# Patient Record
Sex: Male | Born: 1935 | Race: White | Hispanic: No | Marital: Married | Smoking: Never smoker
Health system: Southern US, Community
[De-identification: ages and names within clinical notes are randomized; demographics above are authoritative.]

## PROBLEM LIST (undated history)

## (undated) DIAGNOSIS — E119 Type 2 diabetes mellitus without complications: Secondary | ICD-10-CM

## (undated) DIAGNOSIS — I2510 Atherosclerotic heart disease of native coronary artery without angina pectoris: Secondary | ICD-10-CM

## (undated) HISTORY — PX: CORONARY ANGIOPLASTY WITH STENT PLACEMENT: SHX49

## (undated) HISTORY — PX: HERNIA REPAIR: SHX51

---

## 2016-04-28 ENCOUNTER — Emergency Department (HOSPITAL_BASED_OUTPATIENT_CLINIC_OR_DEPARTMENT_OTHER)
Admission: EM | Admit: 2016-04-28 | Discharge: 2016-04-28 | Disposition: A | Discharge status: Home / Self Care | Payer: Medicare Other | Attending: Emergency Medicine | Admitting: Emergency Medicine

## 2016-04-28 ENCOUNTER — Emergency Department (HOSPITAL_BASED_OUTPATIENT_CLINIC_OR_DEPARTMENT_OTHER): Payer: Medicare Other

## 2016-04-28 ENCOUNTER — Encounter (HOSPITAL_BASED_OUTPATIENT_CLINIC_OR_DEPARTMENT_OTHER): Payer: Self-pay | Admitting: *Deleted

## 2016-04-28 DIAGNOSIS — W11XXXA Fall on and from ladder, initial encounter: Secondary | ICD-10-CM | POA: Diagnosis not present

## 2016-04-28 DIAGNOSIS — Y999 Unspecified external cause status: Secondary | ICD-10-CM | POA: Insufficient documentation

## 2016-04-28 DIAGNOSIS — I2510 Atherosclerotic heart disease of native coronary artery without angina pectoris: Secondary | ICD-10-CM | POA: Diagnosis not present

## 2016-04-28 DIAGNOSIS — E119 Type 2 diabetes mellitus without complications: Secondary | ICD-10-CM | POA: Diagnosis not present

## 2016-04-28 DIAGNOSIS — S32810A Multiple fractures of pelvis with stable disruption of pelvic ring, initial encounter for closed fracture: Secondary | ICD-10-CM | POA: Insufficient documentation

## 2016-04-28 DIAGNOSIS — S3021XA Contusion of penis, initial encounter: Secondary | ICD-10-CM | POA: Diagnosis not present

## 2016-04-28 DIAGNOSIS — W19XXXA Unspecified fall, initial encounter: Secondary | ICD-10-CM

## 2016-04-28 DIAGNOSIS — Y9389 Activity, other specified: Secondary | ICD-10-CM | POA: Insufficient documentation

## 2016-04-28 DIAGNOSIS — Y9259 Other trade areas as the place of occurrence of the external cause: Secondary | ICD-10-CM | POA: Insufficient documentation

## 2016-04-28 DIAGNOSIS — Z7982 Long term (current) use of aspirin: Secondary | ICD-10-CM | POA: Diagnosis not present

## 2016-04-28 DIAGNOSIS — Z79899 Other long term (current) drug therapy: Secondary | ICD-10-CM | POA: Insufficient documentation

## 2016-04-28 DIAGNOSIS — S3993XA Unspecified injury of pelvis, initial encounter: Secondary | ICD-10-CM | POA: Diagnosis present

## 2016-04-28 DIAGNOSIS — Z794 Long term (current) use of insulin: Secondary | ICD-10-CM | POA: Diagnosis not present

## 2016-04-28 DIAGNOSIS — R102 Pelvic and perineal pain: Secondary | ICD-10-CM

## 2016-04-28 HISTORY — DX: Atherosclerotic heart disease of native coronary artery without angina pectoris: I25.10

## 2016-04-28 HISTORY — DX: Type 2 diabetes mellitus without complications: E11.9

## 2016-04-28 MED ORDER — TRAMADOL HCL 50 MG PO TABS: 50.0000 mg | ORAL_TABLET | Freq: Four times a day (QID) | ORAL | 0 refills | Status: AC | PRN

## 2016-04-28 MED FILL — traMADol HCL 50 MG TABS: 50 | 4 days supply | Qty: 20 | Fill #0

## 2016-04-28 NOTE — ED Notes (Signed)
ED Provider at bedside. Pt getting dressed. Orthostatic vitals to be collected.

## 2016-04-28 NOTE — ED Triage Notes (Signed)
4 days ago he missed the last step on a step ladder. Fell. Pain in his right hip.

## 2016-04-28 NOTE — ED Notes (Signed)
MD made aware of orthostatic vital signs.

## 2016-04-28 NOTE — ED Notes (Signed)
Patient transported to CT 

## 2016-04-28 NOTE — ED Notes (Signed)
ED Provider at bedside. 

## 2016-04-28 NOTE — ED Notes (Signed)
Patient transported to X-ray 

## 2016-04-28 NOTE — ED Provider Notes (Signed)
[facility]   CSN: [id number] Arrival date & time: 04/28/16  1115     History   Chief Complaint Chief Complaint  Patient presents with  . Fall    HPI Jerry Randall is a 82 y.o. male.  HPI Patient reports he fell in his garage 4 days ago. He was on a stepladder and coming down and then missed a step and landed falling onto his right hip. He denies pain anywhere else. He reports that the pain has been fairly severe. He has been using a walker and getting about his house. He reports he now notes bruising on the inside of his leg. He has come for evaluation because it is not improving. He denies any other areas of pain or injury. Patient otherwise feels well. His daughter has been giving him Tylenol with some relief. No chest pain, shortness of breath, nausea, lightheadedness, headache, weakness numbness or tingling. Patient does not take any blood thinners. Past Medical History:  Diagnosis Date  . Coronary artery disease   . Diabetes mellitus without complication (HCC)     There are no active problems to display for this patient.   Past Surgical History:  Procedure Laterality Date  . CORONARY ANGIOPLASTY WITH STENT PLACEMENT    . HERNIA REPAIR         Home Medications    Prior to Admission medications   Medication Sig Start Date End Date Taking? Authorizing Provider  Aspirin (ASPIR-81 PO) Take by mouth.   Yes Historical Provider, MD  clopidogrel (PLAVIX) 75 MG tablet Take 75 mg by mouth daily.   Yes Historical Provider, MD  glipiZIDE (GLUCOTROL) 10 MG tablet Take 10 mg by mouth daily before breakfast.   Yes Historical Provider, MD  insulin glargine (LANTUS) 100 UNIT/ML injection Inject into the skin at bedtime.   Yes Historical Provider, MD  lisinopril (PRINIVIL,ZESTRIL) 40 MG tablet Take 40 mg by mouth daily.   Yes Historical Provider, MD  METFORMIN HCL PO Take by mouth.   Yes Historical Provider, MD  NIACIN PO Take by mouth.   Yes Historical  Provider, MD  pravastatin (PRAVACHOL) 80 MG tablet Take 80 mg by mouth daily.   Yes Historical Provider, MD  terazosin (HYTRIN) 5 MG capsule Take 5 mg by mouth at bedtime.   Yes Historical Provider, MD  traMADol (ULTRAM) 50 MG tablet Take 1 tablet (50 mg total) by mouth every 6 (six) hours as needed. One to two tablets every 6 hours as needed for pain 04/28/16   Arby BarretteMarcy Joetta Delprado, MD    Family History No family history on file.  Social History Social History  Substance Use Topics  . Smoking status: Never Smoker  . Smokeless tobacco: Never Used  . Alcohol use No     Allergies   Patient has no known allergies.   Review of Systems Review of Systems 10 Systems reviewed and are negative for acute change except as noted in the HPI.   Physical Exam Updated Vital Signs BP 128/67 (BP Location: Right Arm)   Pulse 87   Temp 98.1 F (36.7 C) (Oral)   Resp 18   SpO2 100%   Physical Exam  Constitutional: He is oriented to person, place, and time. He appears well-developed and well-nourished.  HENT:  Head: Normocephalic and atraumatic.  Eyes: Conjunctivae are normal.  Neck: Neck supple.  Cardiovascular: Normal rate and regular rhythm.   No murmur heard. Pulmonary/Chest: Effort normal and breath sounds normal. No respiratory distress. He  exhibits no tenderness.  Abdominal: Soft. He exhibits no distension. There is no tenderness. There is no guarding.  Genitourinary:  Genitourinary Comments: Patient does have ecchymoses at the base of the penis. There is no swelling of the penis or the scrotum. There is tenderness to palpation in the deep inner groin at the abductor insertion.  Musculoskeletal: He exhibits no edema.  Patient does not have any tenderness over the greater trochanters are either side. I'm able to put the right hip through range of motion without significant tenderness. There is ever tenderness to palpation around the ishium. Patient has ecchymoses on the medial right thigh  following the abductor approximately 20 x 10 cm.  Neurological: He is alert and oriented to person, place, and time. No cranial nerve deficit. He exhibits normal muscle tone. Coordination normal.  Skin: Skin is warm and dry.  Psychiatric: He has a normal mood and affect.  Nursing note and vitals reviewed.    ED Treatments / Results  Labs (all labs ordered are listed, but only abnormal results are displayed) Labs Reviewed - No data to display  EKG  EKG Interpretation None       Radiology Dg Pelvis 1-2 Views  Result Date: 04/28/2016 CLINICAL DATA:  Fall 4 days ago from ladder with persistent right groin pain. EXAM: PELVIS - 1-2 VIEW COMPARISON:  None. FINDINGS: Age-indeterminate though presumably acute fractures involving the right superior inferior pubic rami. No definite intra-articular extension. No additional fractures identified. Mild-to-moderate degenerative change of the bilateral hips with joint space loss, subchondral sclerosis and osteophytosis. Degenerative change of the lower lumbar spine is suspected though incompletely evaluated. Regional soft tissues appear normal.  No radiopaque foreign body. IMPRESSION: Findings worrisome for age-indeterminate though presumably acute fractures involving the right superior and inferior pubic rami. Further evaluation with pelvic CT could be performed as indicated. Electronically Signed   By: Simonne ComeJohn  Watts M.D.   On: 04/28/2016 12:25   Ct Pelvis Wo Contrast  Result Date: 04/28/2016 CLINICAL DATA:  Patient states that 4 days ago he missed the last step on a step ladder and Fell, Pain in his right hip EXAM: CT PELVIS WITHOUT CONTRAST TECHNIQUE: Multidetector CT imaging of the pelvis was performed following the standard protocol without intravenous contrast. COMPARISON:  Current standard radiographs suggesting acute fractures of the right superior inferior pubic rami. FINDINGS: There are acute fractures of the right superior and inferior pubic rami.  The superior fracture lies adjacent to the pubic symphysis. The fracture is mildly comminuted. There is no significant displacement. There is an associated hematoma which measures 6 x 4.7 x 3.2 cm. This bulges into the extraperitoneal pelvis indenting the right inferior and lateral bladder. There is no evidence of a bladder injury, however. The inferior pubic rami fracture is oblique, nondisplaced and non comminuted. There are no other fractures. There are bilateral hip joint arthropathic changes with concentric hip joint space narrowing, small marginal osteophytes from the bases of the femoral heads and mild subchondral cystic change along the superior lateral acetabula. Mild degenerative changes noted along the SI joints inferiorly with subchondral sclerosis and inferior spurring. The SI joint is normally aligned. Bones are demineralized. There is no evidence of injury to the prostate or urethra. No free fluid is seen within the pelvis. Is no pelvic adenopathy. The visualized bowel is unremarkable. IMPRESSION: 1. Acute fractures of the right superior and inferior pubic rami as suspected on the current radiographs. 2. There is a hematoma associated with the superior pubic ramus  fracture that indents the right inferior aspect of the bladder. There is no evidence of bladder or urethral injury. 3. No other acute findings. Electronically Signed   By: Amie Portland M.D.   On: 04/28/2016 13:39    Procedures Procedures (including critical care time)  Medications Ordered in ED Medications - No data to display   Initial Impression / Assessment and Plan / ED Course  I have reviewed the triage vital signs and the nursing notes.  Pertinent labs & imaging results that were available during my care of the patient were reviewed by me and considered in my medical decision making (see chart for details).  Clinical Course   Consult: Have reviewed to Dr. Lindie Spruce. If patient is stable and without evidence of symptomatic  blood loss and able to urinate without obstruction, can continue to treat outpatient with pain control and follow-up.  Final Clinical Impressions(s) / ED Diagnoses   Final diagnoses:  Multiple closed fractures of pelvis with stable disruption of pelvic ring, initial encounter (HCC)   Patient fell 4 days ago. There is evidence of resolving hematoma with ecchymosis at the base of the penis in the medial thigh. Patient otherwise is well in appearance. He is alert with normal mental status change and no other complaints. He has been using a walker at home successfully and with the assistance of his daughter. At this time, patient does not show clinical evidence of ongoing bleeding or complications of hematoma around pelvic fracture. Plan will be to continue to treat for pain and have the patient nonweightbearing using a walker for transfers. Signs and symptoms which return are reviewed. New Prescriptions New Prescriptions   TRAMADOL (ULTRAM) 50 MG TABLET    Take 1 tablet (50 mg total) by mouth every 6 (six) hours as needed. One to two tablets every 6 hours as needed for pain     Arby Barrette, MD 04/28/16 [phone]

## 2018-04-30 IMAGING — CT CT PELVIS W/O CM
2 of 3 series · 16 of 46 positions shown, 18 images · non-contrast
Comparison: Current standard radiographs suggesting acute fractures
of the right superior inferior pubic rami.

CLINICAL DATA: Patient states that 4 days ago he missed the last
step on a step ladder and Fell, Pain in his right hip

EXAM:
CT PELVIS WITHOUT CONTRAST
TECHNIQUE: Multidetector CT imaging of the pelvis was performed following the
standard protocol without intravenous contrast.

[Series 2: axial bone · axial · 0.79mm/px · z∈[-279,-35]mm · 13 of 142 slices shown, 15 images]
[im 10/142  soft-tissue]
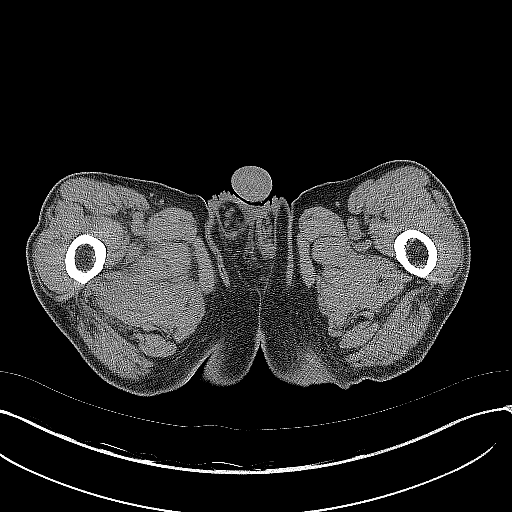
[im 10/142  bone]
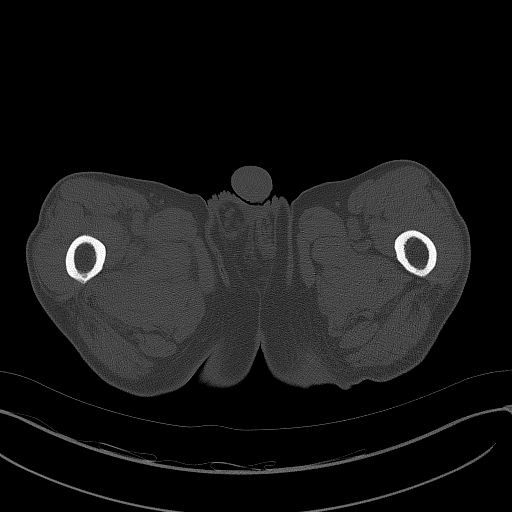
[im 19/142  soft-tissue]
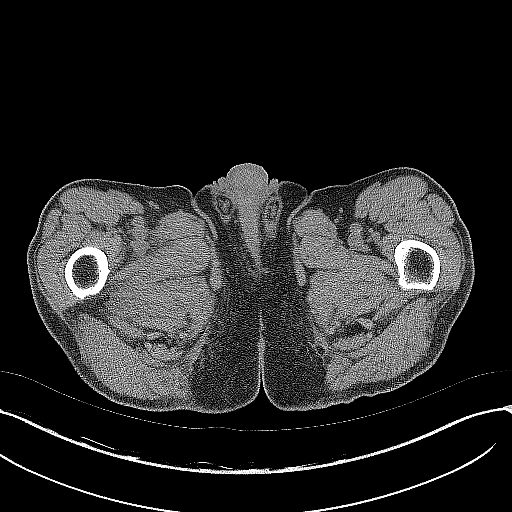
[im 28/142  soft-tissue]
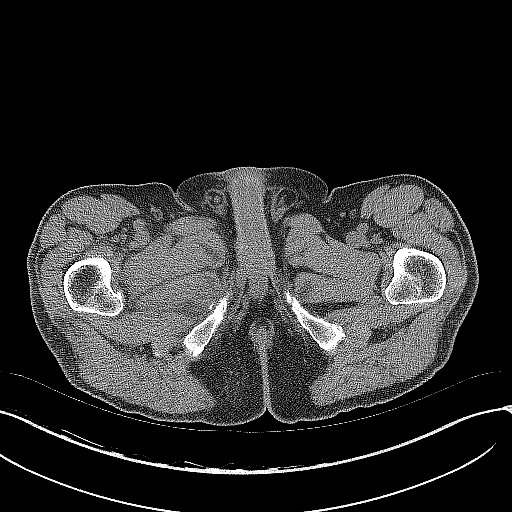
[im 41/142  soft-tissue]
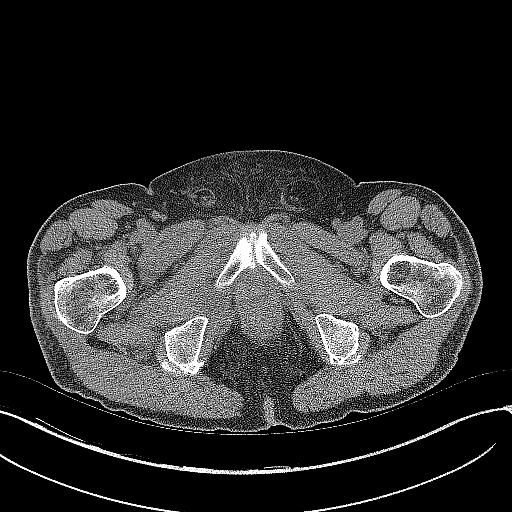
[im 51/142  soft-tissue]
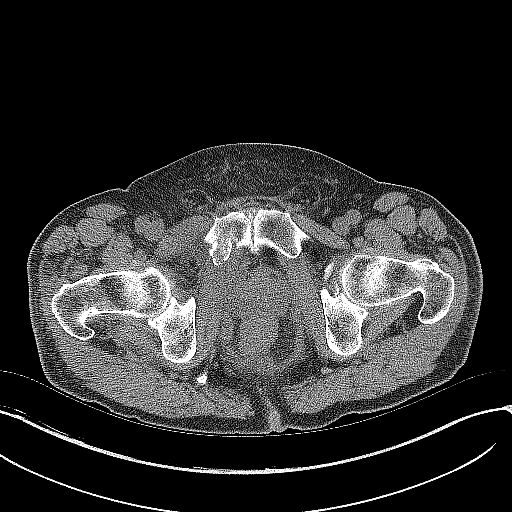
[im 60/142  soft-tissue]
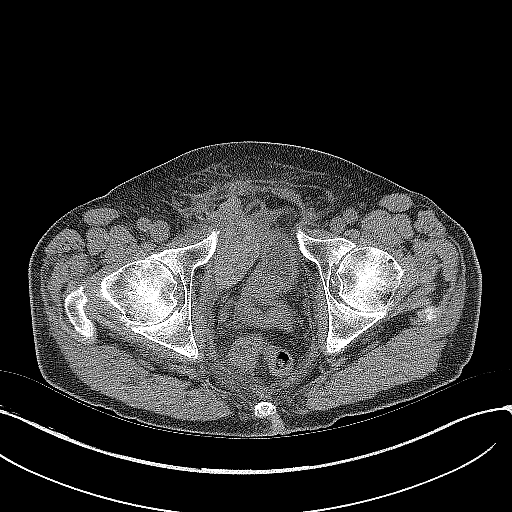
[im 73/142  soft-tissue]
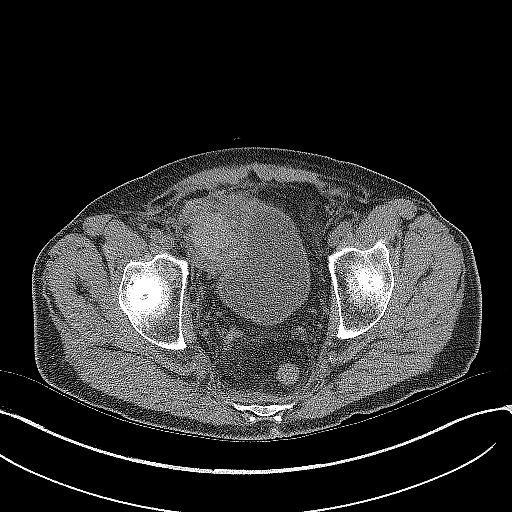
[im 82/142  soft-tissue]
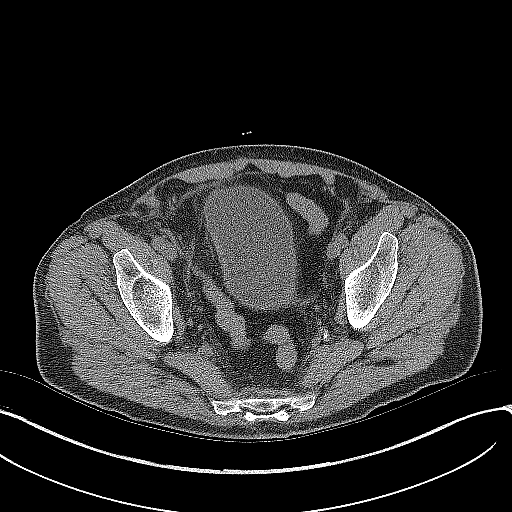
[im 91/142  soft-tissue]
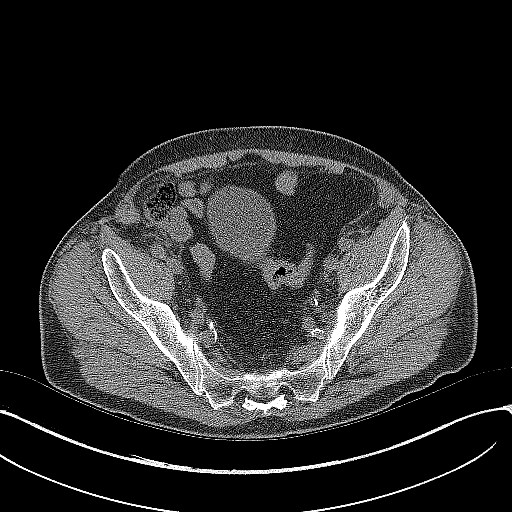
[im 91/142  bone]
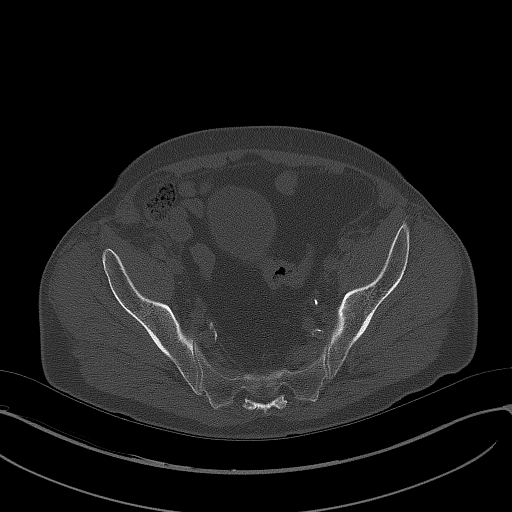
[im 101/142  soft-tissue]
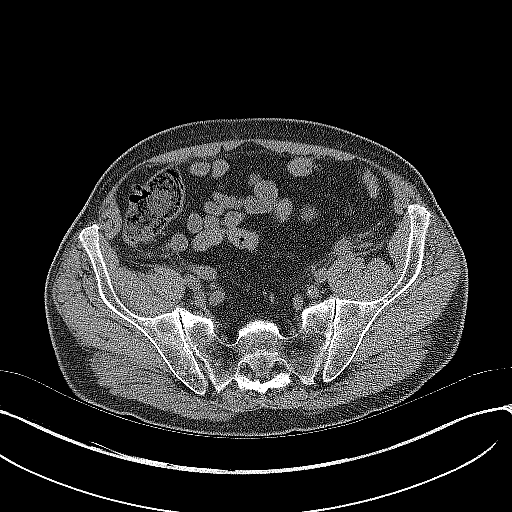
[im 114/142  soft-tissue]
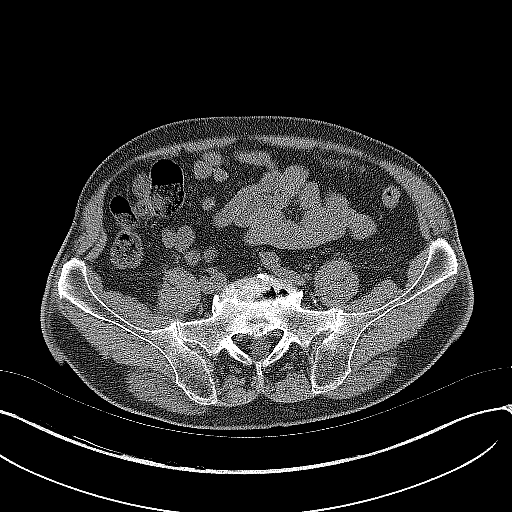
[im 123/142  soft-tissue]
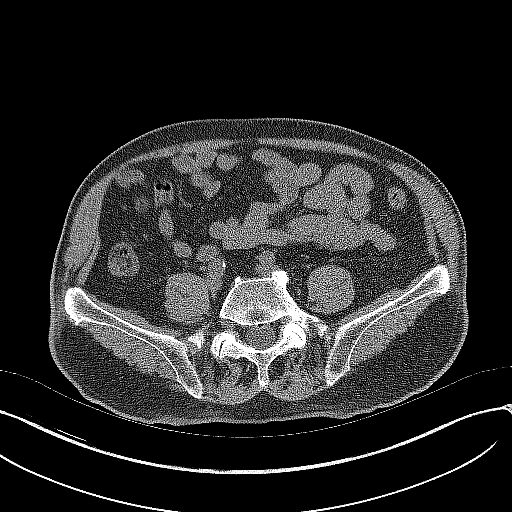
[im 132/142  soft-tissue]
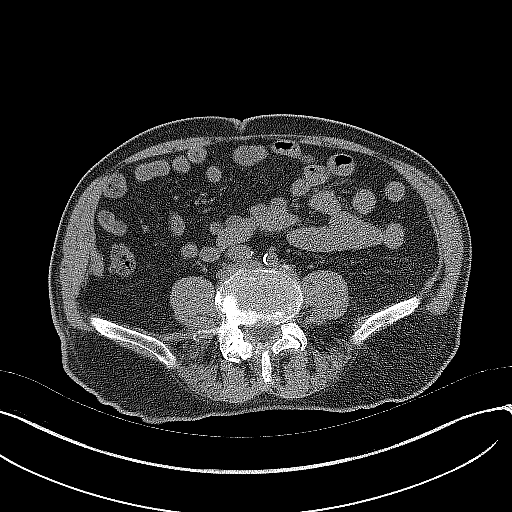

[Series 5: coronal st · coronal · 0.58mm/px · 3 of 131 slices shown]
[im 44/131  soft-tissue]
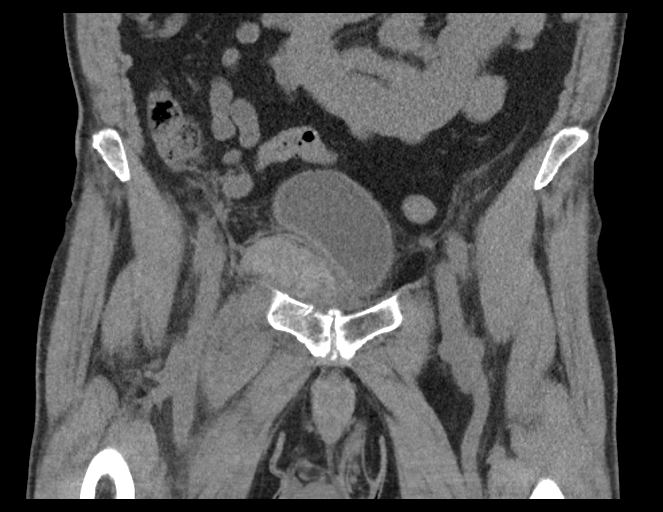
[im 58/131  soft-tissue]
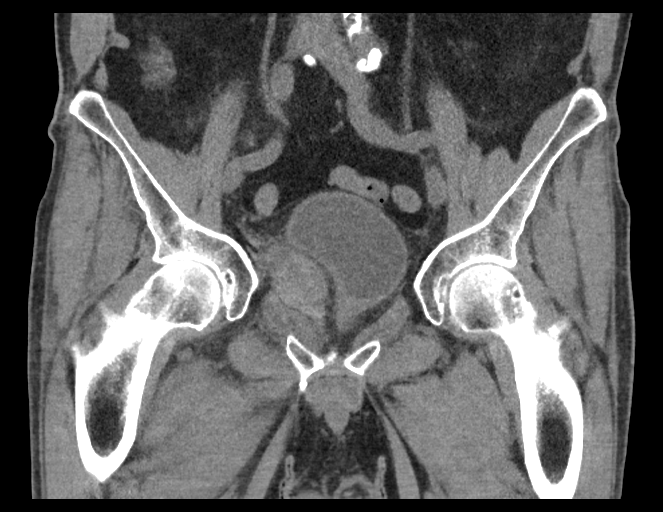
[im 73/131  soft-tissue]
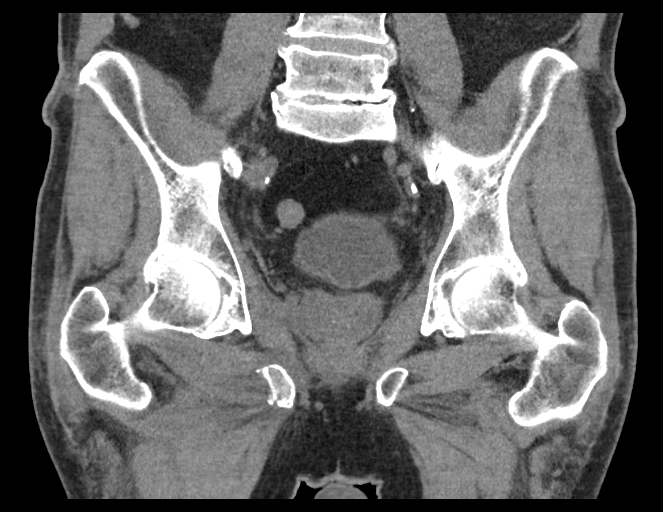

[16 of 46 positions shown; findings below may reference images not displayed]

FINDINGS: There are acute fractures of the right superior and inferior pubic
rami. The superior fracture lies adjacent to the pubic symphysis.
The fracture is mildly comminuted. There is no significant
displacement. There is an associated hematoma which measures 6 x
x 3.2 cm. This bulges into the extraperitoneal pelvis indenting the
right inferior and lateral bladder. There is no evidence of a
bladder injury, however.

The inferior pubic rami fracture is oblique, nondisplaced and non
comminuted.

There are no other fractures. There are bilateral hip joint
arthropathic changes with concentric hip joint space narrowing,
small marginal osteophytes from the bases of the femoral heads and
mild subchondral cystic change along the superior lateral acetabula.
Mild degenerative changes noted along the SI joints inferiorly with
subchondral sclerosis and inferior spurring. The SI joint is
normally aligned.

Bones are demineralized.

There is no evidence of injury to the prostate or urethra. No free
fluid is seen within the pelvis. Is no pelvic adenopathy. The
visualized bowel is unremarkable.
IMPRESSION: 1. Acute fractures of the right superior and inferior pubic rami as
suspected on the current radiographs.
2. There is a hematoma associated with the superior pubic ramus
fracture that indents the right inferior aspect of the bladder.
There is no evidence of bladder or urethral injury.
3. No other acute findings.

## 2018-08-25 DEATH — deceased
